# Patient Record
Sex: Female | Born: 1965 | Race: White | Hispanic: No | Marital: Married | Smoking: Former smoker
Health system: Southern US, Community
[De-identification: ages and names within clinical notes are randomized; demographics above are authoritative.]

## PROBLEM LIST (undated history)

## (undated) DIAGNOSIS — E119 Type 2 diabetes mellitus without complications: Secondary | ICD-10-CM

## (undated) DIAGNOSIS — F32A Depression, unspecified: Secondary | ICD-10-CM

## (undated) DIAGNOSIS — K219 Gastro-esophageal reflux disease without esophagitis: Secondary | ICD-10-CM

## (undated) DIAGNOSIS — F329 Major depressive disorder, single episode, unspecified: Secondary | ICD-10-CM

## (undated) DIAGNOSIS — M1990 Unspecified osteoarthritis, unspecified site: Secondary | ICD-10-CM

## (undated) DIAGNOSIS — R51 Headache: Secondary | ICD-10-CM

## (undated) DIAGNOSIS — F419 Anxiety disorder, unspecified: Secondary | ICD-10-CM

## (undated) DIAGNOSIS — I10 Essential (primary) hypertension: Secondary | ICD-10-CM

---

## 2011-06-24 HISTORY — PX: JOINT REPLACEMENT: SHX530

## 2013-05-04 ENCOUNTER — Other Ambulatory Visit (HOSPITAL_COMMUNITY): Payer: Self-pay | Admitting: Orthopaedic Surgery

## 2013-05-12 ENCOUNTER — Encounter (HOSPITAL_COMMUNITY)
Admission: RE | Admit: 2013-05-12 | Discharge: 2013-05-12 | Disposition: A | Discharge status: Home / Self Care | Payer: BC Managed Care – PPO | Source: Ambulatory Visit | Attending: Orthopaedic Surgery | Admitting: Orthopaedic Surgery

## 2013-05-12 DIAGNOSIS — M79609 Pain in unspecified limb: Secondary | ICD-10-CM | POA: Insufficient documentation

## 2013-05-12 MED ORDER — TECHNETIUM TC 99M MEDRONATE IV KIT
25.0000 | PACK | Freq: Once | INTRAVENOUS | Status: AC | PRN
  Administered 2013-05-12: 25 via INTRAVENOUS

## 2013-05-16 ENCOUNTER — Other Ambulatory Visit: Payer: Self-pay | Admitting: Orthopaedic Surgery

## 2013-05-16 DIAGNOSIS — M898X9 Other specified disorders of bone, unspecified site: Secondary | ICD-10-CM

## 2013-05-18 ENCOUNTER — Ambulatory Visit
Admission: RE | Admit: 2013-05-18 | Discharge: 2013-05-18 | Disposition: A | Discharge status: Home / Self Care | Payer: BC Managed Care – PPO | Source: Ambulatory Visit | Attending: Orthopaedic Surgery | Admitting: Orthopaedic Surgery

## 2013-05-18 ENCOUNTER — Other Ambulatory Visit: Payer: Self-pay | Admitting: Orthopaedic Surgery

## 2013-05-18 DIAGNOSIS — M898X9 Other specified disorders of bone, unspecified site: Secondary | ICD-10-CM

## 2013-06-03 ENCOUNTER — Encounter (HOSPITAL_COMMUNITY): Payer: Self-pay | Admitting: Pharmacy Technician

## 2013-06-07 ENCOUNTER — Other Ambulatory Visit (HOSPITAL_COMMUNITY): Payer: Self-pay | Admitting: Orthopaedic Surgery

## 2013-06-07 ENCOUNTER — Encounter (HOSPITAL_COMMUNITY)
Admission: RE | Admit: 2013-06-07 | Discharge: 2013-06-07 | Disposition: A | Discharge status: Home / Self Care | Payer: BC Managed Care – PPO | Source: Ambulatory Visit | Attending: Orthopaedic Surgery | Admitting: Orthopaedic Surgery

## 2013-06-07 ENCOUNTER — Ambulatory Visit (HOSPITAL_COMMUNITY)
Admission: RE | Admit: 2013-06-07 | Discharge: 2013-06-07 | Disposition: A | Discharge status: Home / Self Care | Payer: BC Managed Care – PPO | Source: Ambulatory Visit | Attending: Orthopaedic Surgery | Admitting: Orthopaedic Surgery

## 2013-06-07 ENCOUNTER — Encounter (HOSPITAL_COMMUNITY): Payer: Self-pay

## 2013-06-07 DIAGNOSIS — I10 Essential (primary) hypertension: Secondary | ICD-10-CM | POA: Insufficient documentation

## 2013-06-07 DIAGNOSIS — E119 Type 2 diabetes mellitus without complications: Secondary | ICD-10-CM | POA: Insufficient documentation

## 2013-06-07 HISTORY — DX: Gastro-esophageal reflux disease without esophagitis: K21.9

## 2013-06-07 HISTORY — DX: Essential (primary) hypertension: I10

## 2013-06-07 HISTORY — DX: Anxiety disorder, unspecified: F41.9

## 2013-06-07 HISTORY — DX: Headache: R51

## 2013-06-07 HISTORY — DX: Type 2 diabetes mellitus without complications: E11.9

## 2013-06-07 HISTORY — DX: Unspecified osteoarthritis, unspecified site: M19.90

## 2013-06-07 HISTORY — DX: Major depressive disorder, single episode, unspecified: F32.9

## 2013-06-07 HISTORY — DX: Depression, unspecified: F32.A

## 2013-06-07 LAB — CBC
HCT: 39 % (ref 36.0–46.0)
MCHC: 31.8 g/dL (ref 30.0–36.0)
MCV: 80.7 fL (ref 78.0–100.0)
Platelets: 389 10*3/uL (ref 150–400)
RBC: 4.83 MIL/uL (ref 3.87–5.11)
RDW: 14.8 % (ref 11.5–15.5)
WBC: 14.1 10*3/uL — ABNORMAL HIGH (ref 4.0–10.5)

## 2013-06-07 LAB — COMPREHENSIVE METABOLIC PANEL
ALT: 13 U/L (ref 0–35)
AST: 14 U/L (ref 0–37)
Albumin: 3.7 g/dL (ref 3.5–5.2)
CO2: 25 mEq/L (ref 19–32)
Chloride: 102 mEq/L (ref 96–112)
GFR calc non Af Amer: 76 mL/min — ABNORMAL LOW (ref >90)
Potassium: 4.7 mEq/L (ref 3.5–5.1)
Sodium: 138 mEq/L (ref 135–145)
Total Bilirubin: 0.3 mg/dL (ref 0.3–1.2)

## 2013-06-07 NOTE — Progress Notes (Signed)
06/07/13 1039  OBSTRUCTIVE SLEEP APNEA  Have you ever been diagnosed with sleep apnea through a sleep study? No  Do you snore loudly (loud enough to be heard through closed doors)?  1  Do you often feel tired, fatigued, or sleepy during the daytime? 1  Has anyone observed you stop breathing during your sleep? 0  Do you have, or are you being treated for high blood pressure? 1  BMI more than 35 kg/m2? 1  Age over 49 years old? 0  Neck circumference greater than 40 cm/18 inches? 0  Gender: 0  Obstructive Sleep Apnea Score 4  Score 4 or greater  Results sent to PCP    06/07/13 1039  OBSTRUCTIVE SLEEP APNEA  Have you ever been diagnosed with sleep apnea through a sleep study? No  Do you snore loudly (loud enough to be heard through closed doors)?  1  Do you often feel tired, fatigued, or sleepy during the daytime? 1  Has anyone observed you stop breathing during your sleep? 0  Do you have, or are you being treated for high blood pressure? 1  BMI more than 35 kg/m2? 1  Age over 94 years old? 0  Neck circumference greater than 40 cm/18 inches? 0  Gender: 0  Obstructive Sleep Apnea Score 4  Score 4 or greater  Results sent to PCP

## 2013-06-07 NOTE — Progress Notes (Addendum)
Requested any old ekg for comparison from her PCP  Dr. Meredith Mody  [phone]--DA 11:34 AM ..PCP has no old EKG to compare.  Concerning the WBC result--per anesthesia guideline, this result is within acceptable parameters.Marland Kitchen  DA

## 2013-06-07 NOTE — Pre-Procedure Instructions (Signed)
Erica Hughes  06/07/2013   Your procedure is scheduled on:  Friday, Dec. 19th              [facility] Parking is available)   Report to [facility]  2 * 3 at  5:30 AM.  Call this number if you have problems the morning of surgery: [phone]   Remember:   Do not eat food or drink liquids after midnight Thursday.   Take these medicines the morning of surgery with A SIP OF WATER: Abilify, Gabapentin, Metoprolol, Omeprazole   Do not wear jewelry, make-up or nail polish.  Do not wear lotions, powders, or perfumes. You may NOT wear deodorant.  Do not shave underarms & legs 48 hours prior to surgery.    Do not bring valuables to the hospital.  [facility] is not responsible for any belongings or valuables.               Contacts, dentures or bridgework may not be worn into surgery.  Leave suitcase in the car. After surgery it may be brought to your room.  For patients admitted to the hospital, discharge time is determined by your treatment team.               Patients discharged the day of surgery will not be allowed to drive home.   Name and phone number of your driver:    Special Instructions: Shower using CHG 2 nights before surgery and the night before surgery.  If you shower the day of surgery use CHG.  Use special wash - you have one bottle of CHG for all showers.  You should use approximately 1/3 of the bottle for each shower.    Please read over the following fact sheets that you were given: Pain Booklet and Surgical Site Infection Prevention

## 2013-06-07 NOTE — Progress Notes (Signed)
Left a message for Erica Hughes to have Dr. Ophelia Charter sign his orders.  DA

## 2013-06-07 NOTE — Progress Notes (Deleted)
06/07/13 1020  OBSTRUCTIVE SLEEP APNEA  Have you ever been diagnosed with sleep apnea through a sleep study? No  Do you snore loudly (loud enough to be heard through closed doors)?  1  Do you often feel tired, fatigued, or sleepy during the daytime? 1  Has anyone observed you stop breathing during your sleep? 0  Do you have, or are you being treated for high blood pressure? 1  BMI more than 35 kg/m2? 1  Age over 49 years old? 0  Neck circumference greater than 40 cm/18 inches? 0  Gender: 0  Obstructive Sleep Apnea Score 4  Score 4 or greater  Results sent to PCP

## 2013-06-08 NOTE — H&P (Signed)
[facility]   A Division of [employer] [facility]   [address] [place], [place] [address] Telephone: [phone]  Fax: [phone]     PATIENT: Autumm, Hattery   MR#: [id number]  DOB: [birth year]       A 47 year old female, 1 year post total knee arthroplasty by Dr. Chaney Malling was referred by her sister-in-law.  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 Chaney Malling in 2013 at [facility]. She has had problems with range of motion.  She has been able to ambulate without assistive devices but has a 10- to 20-degree extension lag.  Has pain with flexion past 80 degrees with a large, palpable heterotopic mass in the distal vastus lateralis.  She has been through extensive physical therapy.  She is normally followed by Dr. Meredith Mody.  Patient is a diabetic. She has been worked up with 3-phase bone scan which shows increased uptake superior to the femoral condyles on the left.  There is a calcified mass in the lateralis that measures 6.5 cm x 5 cm and extends on the spot from just proximal to the anterior femoral cut just above the prosthesis.  Postop radiographs show metal-backed patella, LCS is satisfactory position.  Tibial component is in slight varus.  Patellar tracking is satisfactory.  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  A small amount of posterior calcification in the posterior capsule.  Patient denies any falling or significant trauma to her thigh postoperatively. She does have a palpable quad tendon which is more easily palpated medially and slightly centrally.  She has tenderness over the heterotopic bone ossification.  This is benign in appearance.  Mild uptake around the femoral condyles, slightly more prominent than normal but no changes on plain radiograph that would suggest component loosening.   CT scan shows heterotopic bone formation proximal to the component along the  anterior cortex without notching. Some calcification appears to be in the quadriceps tendon, and this may be consistent with partial rupture which has healed with extensive calcification.   Patient continues to have problems; has not been able to progress.  She states she is interested in having surgery done in [place].   CURRENT MEDICATIONS:  Include metformin for her diabetes.  She takes Lantus insulin 80 units daily.  She is on enalapril, Pristiq, Neurontin, Abilify, vitamin D3, Valium 2 mg, Seroquel, zolpidem, metoprolol, omeprazole, calcium, and Depo-Provera.  Dosages other than her insulin are not known by the patient, but she will bring them in.   ALLERGIES:  CODEINE and PENICILLIN.   PAST SURGICAL HISTORY:  Includes December 2013 left total knee arthroplasty.   FAMILY HISTORY:  Positive for hypertension, lung cancer, diabetes, and heart disease.   SOCIAL HISTORY:  She is married to her husband, Tawanna Cooler.  She does not smoke; used to be a smoker and quit 21 years ago.  She denies alcohol consumption.   FOURTEEN-POINT REVIEW OF SYSTEMS:  Positive for acid reflux, anxiety, arthritis, depression, diabetes insulin dependent, hypertension, and history of migraines.   PHYSICAL EXAMINATION:  Patient is alert and oriented, WN, WN, NAD.  Extraocular movements intact.  Oriented x3.  Height 5 feet 5 inches, weight 274.  BP 132/80.  No accessory muscle inspiratory effort.  No cardiac murmurs.  Hip range of motion is normal, 1+ knee jerk.  She has some weakness on quad testing, palpable heterotopic bone above the patella proximal lateral where she is tender with  extension with flexion.     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   We discussed options with her.  We discussed postoperative Indocin versus radiation treatment.  CT scan shows that this distally is attached to the distal  femoral cortex just above the anterior femoral cut.  There is no notching.  She does not have abnormal effusion.  She has had no history of fever or chills.  She has no inguinal lymphadenopathy.  Distal pulses are 2+.     PLAN:  Resection of heterotopic ossification.  We will evaluate the prosthesis at the time of surgery.  I discussed with her I do not think revision of the components is indicated.  If there is quad tendon deficit in a portion of the quad tendon, she may need to have augmentation or repair of the quad tendon post resection of the heterotopic ossification.  This was reviewed with her in detail.  She understands that she may not get improvement in her knee range of motion, and she would get resolution of the mass. There is a risk for recurrence of heterotopic ossification.  All questions answered.  She understands and requests we proceed.    For additional information please see handwritten notes, reports, orders and prescriptions in this chart.      Mark C. Ophelia Charter, M.D.    Auto-Authenticated by Veverly Fells. Ophelia Charter, M.D.

## 2013-06-09 MED ORDER — VANCOMYCIN HCL 10 G IV SOLR
1500.0000 mg | INTRAVENOUS | Status: DC
  Filled 2013-06-09: qty 1500

## 2013-06-09 MED ORDER — CHLORHEXIDINE GLUCONATE 4 % EX LIQD: 60.0000 mL | Freq: Once | CUTANEOUS | Status: DC

## 2013-06-10 ENCOUNTER — Inpatient Hospital Stay (HOSPITAL_COMMUNITY)
Admission: RE | Admit: 2013-06-10 | Discharge: 2013-06-12 | DRG: 502 | Disposition: A | Discharge status: Home / Self Care | Payer: BC Managed Care – PPO | Source: Ambulatory Visit | Attending: Orthopaedic Surgery | Admitting: Orthopaedic Surgery

## 2013-06-10 ENCOUNTER — Encounter (HOSPITAL_COMMUNITY): Payer: BC Managed Care – PPO | Admitting: Anesthesiology

## 2013-06-10 ENCOUNTER — Encounter (HOSPITAL_COMMUNITY): Payer: Self-pay | Admitting: Surgery

## 2013-06-10 ENCOUNTER — Encounter (HOSPITAL_COMMUNITY)
Admission: RE | Disposition: A | Discharge status: Home / Self Care | Payer: Self-pay | Source: Ambulatory Visit | Attending: Orthopaedic Surgery

## 2013-06-10 ENCOUNTER — Inpatient Hospital Stay (HOSPITAL_COMMUNITY): Payer: BC Managed Care – PPO

## 2013-06-10 ENCOUNTER — Inpatient Hospital Stay (HOSPITAL_COMMUNITY): Payer: BC Managed Care – PPO | Admitting: Anesthesiology

## 2013-06-10 DIAGNOSIS — I10 Essential (primary) hypertension: Secondary | ICD-10-CM | POA: Diagnosis present

## 2013-06-10 DIAGNOSIS — M6140 Other calcification of muscle, unspecified site: Principal | ICD-10-CM | POA: Diagnosis present

## 2013-06-10 DIAGNOSIS — F3289 Other specified depressive episodes: Secondary | ICD-10-CM | POA: Diagnosis present

## 2013-06-10 DIAGNOSIS — K219 Gastro-esophageal reflux disease without esophagitis: Secondary | ICD-10-CM | POA: Diagnosis present

## 2013-06-10 DIAGNOSIS — Z87891 Personal history of nicotine dependence: Secondary | ICD-10-CM

## 2013-06-10 DIAGNOSIS — F411 Generalized anxiety disorder: Secondary | ICD-10-CM | POA: Diagnosis present

## 2013-06-10 DIAGNOSIS — M25669 Stiffness of unspecified knee, not elsewhere classified: Secondary | ICD-10-CM | POA: Diagnosis present

## 2013-06-10 DIAGNOSIS — F329 Major depressive disorder, single episode, unspecified: Secondary | ICD-10-CM | POA: Diagnosis present

## 2013-06-10 DIAGNOSIS — Z96659 Presence of unspecified artificial knee joint: Secondary | ICD-10-CM

## 2013-06-10 DIAGNOSIS — Z794 Long term (current) use of insulin: Secondary | ICD-10-CM

## 2013-06-10 DIAGNOSIS — M6520 Calcific tendinitis, unspecified site: Secondary | ICD-10-CM | POA: Diagnosis present

## 2013-06-10 DIAGNOSIS — M129 Arthropathy, unspecified: Secondary | ICD-10-CM | POA: Diagnosis present

## 2013-06-10 DIAGNOSIS — M898X9 Other specified disorders of bone, unspecified site: Secondary | ICD-10-CM | POA: Diagnosis present

## 2013-06-10 DIAGNOSIS — E119 Type 2 diabetes mellitus without complications: Secondary | ICD-10-CM | POA: Diagnosis present

## 2013-06-10 HISTORY — PX: KNEE CLOSED REDUCTION: SHX995

## 2013-06-10 HISTORY — PX: OSTEOCHONDROMA EXCISION: SHX2137

## 2013-06-10 LAB — URINE MICROSCOPIC-ADD ON

## 2013-06-10 LAB — URINALYSIS, ROUTINE W REFLEX MICROSCOPIC
Glucose, UA: NEGATIVE mg/dL
Ketones, ur: 15 mg/dL — AB
Leukocytes, UA: NEGATIVE
Protein, ur: 30 mg/dL — AB
Specific Gravity, Urine: 1.036 — ABNORMAL HIGH (ref 1.005–1.030)
pH: 5.5 (ref 5.0–8.0)

## 2013-06-10 LAB — GLUCOSE, CAPILLARY: Glucose-Capillary: 229 mg/dL — ABNORMAL HIGH (ref 70–99)

## 2013-06-10 LAB — APTT: aPTT: 29 seconds (ref 24–37)

## 2013-06-10 LAB — PROTIME-INR: INR: 0.94 (ref 0.00–1.49)

## 2013-06-10 SURGERY — MANIPULATION, KNEE, CLOSED
Anesthesia: General | Site: Knee | Laterality: Left

## 2013-06-10 MED ORDER — FENTANYL CITRATE 0.05 MG/ML IJ SOLN
INTRAMUSCULAR | Status: DC | PRN
  Administered 2013-06-10: 50 ug via INTRAVENOUS
  Administered 2013-06-10: 100 ug via INTRAVENOUS
  Administered 2013-06-10: 50 ug via INTRAVENOUS
  Administered 2013-06-10: 150 ug via INTRAVENOUS
  Administered 2013-06-10: 50 ug via INTRAVENOUS

## 2013-06-10 MED ORDER — BISACODYL 10 MG RE SUPP: 10.0000 mg | Freq: Every day | RECTAL | Status: DC | PRN

## 2013-06-10 MED ORDER — METFORMIN HCL 500 MG PO TABS: 1000.0000 mg | ORAL_TABLET | Freq: Two times a day (BID) | ORAL | Status: DC

## 2013-06-10 MED ORDER — QUETIAPINE FUMARATE ER 200 MG PO TB24
200.0000 mg | ORAL_TABLET | Freq: Every day | ORAL | Status: DC
  Administered 2013-06-10 – 2013-06-11 (×2): 200 mg via ORAL
  Filled 2013-06-10 (×4): qty 1

## 2013-06-10 MED ORDER — CLINDAMYCIN PHOSPHATE 900 MG/50ML IV SOLN
900.0000 mg | INTRAVENOUS | Status: AC
  Administered 2013-06-10: 900 mg via INTRAVENOUS
  Filled 2013-06-10: qty 50

## 2013-06-10 MED ORDER — ONDANSETRON HCL 4 MG/2ML IJ SOLN
INTRAMUSCULAR | Status: DC | PRN
  Administered 2013-06-10: 4 mg via INTRAVENOUS

## 2013-06-10 MED ORDER — 0.9 % SODIUM CHLORIDE (POUR BTL) OPTIME
TOPICAL | Status: DC | PRN
  Administered 2013-06-10: 1000 mL

## 2013-06-10 MED ORDER — METFORMIN HCL 500 MG PO TABS
1000.0000 mg | ORAL_TABLET | Freq: Two times a day (BID) | ORAL | Status: DC
  Administered 2013-06-10 – 2013-06-12 (×4): 1000 mg via ORAL
  Filled 2013-06-10 (×6): qty 2

## 2013-06-10 MED ORDER — INSULIN GLARGINE 100 UNIT/ML ~~LOC~~ SOLN
20.0000 [IU] | Freq: Every day | SUBCUTANEOUS | Status: DC
  Administered 2013-06-10 – 2013-06-11 (×2): 20 [IU] via SUBCUTANEOUS
  Filled 2013-06-10 (×3): qty 0.2

## 2013-06-10 MED ORDER — ACETAMINOPHEN 650 MG RE SUPP: 650.0000 mg | Freq: Four times a day (QID) | RECTAL | Status: DC | PRN

## 2013-06-10 MED ORDER — MIDAZOLAM HCL 5 MG/5ML IJ SOLN
INTRAMUSCULAR | Status: DC | PRN
  Administered 2013-06-10: 1 mg via INTRAVENOUS

## 2013-06-10 MED ORDER — INSULIN ASPART 100 UNIT/ML ~~LOC~~ SOLN
0.0000 [IU] | Freq: Three times a day (TID) | SUBCUTANEOUS | Status: DC
  Administered 2013-06-10 – 2013-06-11 (×3): 3 [IU] via SUBCUTANEOUS
  Administered 2013-06-11: 5 [IU] via SUBCUTANEOUS
  Administered 2013-06-12 (×2): 3 [IU] via SUBCUTANEOUS

## 2013-06-10 MED ORDER — ROCURONIUM BROMIDE 100 MG/10ML IV SOLN
INTRAVENOUS | Status: DC | PRN
  Administered 2013-06-10: 10 mg via INTRAVENOUS
  Administered 2013-06-10: 30 mg via INTRAVENOUS

## 2013-06-10 MED ORDER — ACETAMINOPHEN 500 MG PO TABS: 1000.0000 mg | ORAL_TABLET | Freq: Four times a day (QID) | ORAL | Status: DC | PRN

## 2013-06-10 MED ORDER — GABAPENTIN 300 MG PO CAPS
300.0000 mg | ORAL_CAPSULE | Freq: Two times a day (BID) | ORAL | Status: DC
  Administered 2013-06-10 – 2013-06-12 (×4): 300 mg via ORAL
  Filled 2013-06-10 (×5): qty 1

## 2013-06-10 MED ORDER — FENTANYL CITRATE 0.05 MG/ML IJ SOLN
25.0000 ug | INTRAMUSCULAR | Status: DC | PRN
  Administered 2013-06-10 (×4): 25 ug via INTRAVENOUS

## 2013-06-10 MED ORDER — ONDANSETRON HCL 4 MG PO TABS: 4.0000 mg | ORAL_TABLET | Freq: Four times a day (QID) | ORAL | Status: DC | PRN

## 2013-06-10 MED ORDER — MENTHOL 3 MG MT LOZG
1.0000 | LOZENGE | OROMUCOSAL | Status: DC | PRN
  Administered 2013-06-11: 3 mg via ORAL
  Filled 2013-06-10: qty 9

## 2013-06-10 MED ORDER — ARIPIPRAZOLE 5 MG PO TABS
5.0000 mg | ORAL_TABLET | Freq: Every day | ORAL | Status: DC
  Administered 2013-06-11 – 2013-06-12 (×2): 5 mg via ORAL
  Filled 2013-06-10 (×2): qty 1

## 2013-06-10 MED ORDER — FLEET ENEMA 7-19 GM/118ML RE ENEM: 1.0000 | ENEMA | Freq: Once | RECTAL | Status: AC | PRN

## 2013-06-10 MED ORDER — METHOCARBAMOL 100 MG/ML IJ SOLN
500.0000 mg | Freq: Four times a day (QID) | INTRAVENOUS | Status: DC | PRN
  Filled 2013-06-10: qty 5

## 2013-06-10 MED ORDER — CHOLECALCIFEROL 10 MCG (400 UNIT) PO TABS
400.0000 [IU] | ORAL_TABLET | Freq: Two times a day (BID) | ORAL | Status: DC
  Administered 2013-06-10 – 2013-06-12 (×4): 400 [IU] via ORAL
  Filled 2013-06-10 (×5): qty 1

## 2013-06-10 MED ORDER — PANTOPRAZOLE SODIUM 40 MG PO TBEC
40.0000 mg | DELAYED_RELEASE_TABLET | Freq: Every day | ORAL | Status: DC
  Administered 2013-06-11 – 2013-06-12 (×2): 40 mg via ORAL
  Filled 2013-06-10 (×2): qty 1

## 2013-06-10 MED ORDER — INSULIN GLARGINE 100 UNIT/ML ~~LOC~~ SOLN
60.0000 [IU] | Freq: Every day | SUBCUTANEOUS | Status: DC
  Administered 2013-06-11 – 2013-06-12 (×2): 60 [IU] via SUBCUTANEOUS
  Filled 2013-06-10 (×2): qty 0.6

## 2013-06-10 MED ORDER — METOCLOPRAMIDE HCL 10 MG PO TABS: 5.0000 mg | ORAL_TABLET | Freq: Three times a day (TID) | ORAL | Status: DC | PRN

## 2013-06-10 MED ORDER — METOCLOPRAMIDE HCL 5 MG/ML IJ SOLN
5.0000 mg | Freq: Once | INTRAMUSCULAR | Status: AC
  Administered 2013-06-10: 5 mg via INTRAVENOUS

## 2013-06-10 MED ORDER — VENLAFAXINE HCL ER 75 MG PO CP24
75.0000 mg | ORAL_CAPSULE | Freq: Every day | ORAL | Status: DC
  Administered 2013-06-11 – 2013-06-12 (×2): 75 mg via ORAL
  Filled 2013-06-10 (×3): qty 1

## 2013-06-10 MED ORDER — FENTANYL CITRATE 0.05 MG/ML IJ SOLN
INTRAMUSCULAR | Status: AC
  Filled 2013-06-10: qty 2

## 2013-06-10 MED ORDER — ZOLPIDEM TARTRATE 5 MG PO TABS
5.0000 mg | ORAL_TABLET | Freq: Every evening | ORAL | Status: DC | PRN
  Administered 2013-06-10: 5 mg via ORAL
  Filled 2013-06-10: qty 1

## 2013-06-10 MED ORDER — METOCLOPRAMIDE HCL 5 MG/ML IJ SOLN
INTRAMUSCULAR | Status: AC
  Filled 2013-06-10: qty 2

## 2013-06-10 MED ORDER — METOCLOPRAMIDE HCL 5 MG/ML IJ SOLN
10.0000 mg | Freq: Four times a day (QID) | INTRAMUSCULAR | Status: DC | PRN
  Filled 2013-06-10: qty 2

## 2013-06-10 MED ORDER — POTASSIUM CHLORIDE IN NACL 20-0.45 MEQ/L-% IV SOLN
INTRAVENOUS | Status: DC
  Administered 2013-06-10 – 2013-06-11 (×2): via INTRAVENOUS
  Filled 2013-06-10 (×5): qty 1000

## 2013-06-10 MED ORDER — ONDANSETRON HCL 4 MG/2ML IJ SOLN: 4.0000 mg | Freq: Four times a day (QID) | INTRAMUSCULAR | Status: DC | PRN

## 2013-06-10 MED ORDER — KETOROLAC TROMETHAMINE 15 MG/ML IJ SOLN
15.0000 mg | Freq: Four times a day (QID) | INTRAMUSCULAR | Status: AC
  Administered 2013-06-10 – 2013-06-11 (×4): 15 mg via INTRAVENOUS
  Filled 2013-06-10 (×2): qty 1

## 2013-06-10 MED ORDER — PROPOFOL 10 MG/ML IV BOLUS
INTRAVENOUS | Status: DC | PRN
  Administered 2013-06-10: 160 mg via INTRAVENOUS
  Administered 2013-06-10: 40 mg via INTRAVENOUS

## 2013-06-10 MED ORDER — HYDROMORPHONE HCL 2 MG PO TABS: 4.0000 mg | ORAL_TABLET | ORAL | Status: DC | PRN

## 2013-06-10 MED ORDER — LACTATED RINGERS IV SOLN
INTRAVENOUS | Status: DC | PRN
  Administered 2013-06-10 (×2): via INTRAVENOUS

## 2013-06-10 MED ORDER — ACETAMINOPHEN 325 MG PO TABS: 650.0000 mg | ORAL_TABLET | Freq: Four times a day (QID) | ORAL | Status: DC | PRN

## 2013-06-10 MED ORDER — METHOCARBAMOL 500 MG PO TABS: 500.0000 mg | ORAL_TABLET | Freq: Four times a day (QID) | ORAL | Status: DC | PRN

## 2013-06-10 MED ORDER — CALCIUM CARBONATE 600 MG PO TABS
600.0000 mg | ORAL_TABLET | Freq: Two times a day (BID) | ORAL | Status: DC
  Administered 2013-06-10: 600 mg via ORAL
  Filled 2013-06-10 (×4): qty 1

## 2013-06-10 MED ORDER — LIDOCAINE HCL (CARDIAC) 20 MG/ML IV SOLN
INTRAVENOUS | Status: DC | PRN
  Administered 2013-06-10: 80 mg via INTRAVENOUS

## 2013-06-10 MED ORDER — SUCCINYLCHOLINE CHLORIDE 20 MG/ML IJ SOLN
INTRAMUSCULAR | Status: DC | PRN
  Administered 2013-06-10: 100 mg via INTRAVENOUS

## 2013-06-10 MED ORDER — DOCUSATE SODIUM 100 MG PO CAPS
100.0000 mg | ORAL_CAPSULE | Freq: Two times a day (BID) | ORAL | Status: DC
  Administered 2013-06-10 – 2013-06-12 (×4): 100 mg via ORAL
  Filled 2013-06-10 (×5): qty 1

## 2013-06-10 MED ORDER — NEOSTIGMINE METHYLSULFATE 1 MG/ML IJ SOLN
INTRAMUSCULAR | Status: DC | PRN
  Administered 2013-06-10: 3 mg via INTRAVENOUS

## 2013-06-10 MED ORDER — GLYCOPYRROLATE 0.2 MG/ML IJ SOLN
INTRAMUSCULAR | Status: DC | PRN
  Administered 2013-06-10: 0.4 mg via INTRAVENOUS

## 2013-06-10 MED ORDER — SENNOSIDES-DOCUSATE SODIUM 8.6-50 MG PO TABS: 1.0000 | ORAL_TABLET | Freq: Every evening | ORAL | Status: DC | PRN

## 2013-06-10 MED ORDER — ENALAPRIL MALEATE 20 MG PO TABS
20.0000 mg | ORAL_TABLET | Freq: Every day | ORAL | Status: DC
  Administered 2013-06-10 – 2013-06-12 (×3): 20 mg via ORAL
  Filled 2013-06-10 (×3): qty 1

## 2013-06-10 MED ORDER — METOPROLOL TARTRATE 100 MG PO TABS
100.0000 mg | ORAL_TABLET | Freq: Two times a day (BID) | ORAL | Status: DC
  Administered 2013-06-10 – 2013-06-12 (×4): 100 mg via ORAL
  Filled 2013-06-10 (×5): qty 1

## 2013-06-10 MED ORDER — PHENOL 1.4 % MT LIQD: 1.0000 | OROMUCOSAL | Status: DC | PRN

## 2013-06-10 MED ORDER — METOCLOPRAMIDE HCL 5 MG/ML IJ SOLN: 5.0000 mg | Freq: Three times a day (TID) | INTRAMUSCULAR | Status: DC | PRN

## 2013-06-10 MED ORDER — HYDROMORPHONE HCL PF 1 MG/ML IJ SOLN: 0.5000 mg | INTRAMUSCULAR | Status: DC | PRN

## 2013-06-10 MED ORDER — ASPIRIN EC 325 MG PO TBEC
325.0000 mg | DELAYED_RELEASE_TABLET | Freq: Every day | ORAL | Status: DC
  Administered 2013-06-11 – 2013-06-12 (×2): 325 mg via ORAL
  Filled 2013-06-10 (×3): qty 1

## 2013-06-10 MED ORDER — OXYCODONE HCL 5 MG PO TABS: 5.0000 mg | ORAL_TABLET | ORAL | Status: DC | PRN

## 2013-06-10 SURGICAL SUPPLY — 77 items
BANDAGE CONFORM 3  STR LF (GAUZE/BANDAGES/DRESSINGS) IMPLANT
BANDAGE ELASTIC 4 VELCRO ST LF (GAUZE/BANDAGES/DRESSINGS) IMPLANT
BANDAGE ELASTIC 6 VELCRO ST LF (GAUZE/BANDAGES/DRESSINGS) ×2 IMPLANT
BANDAGE ESMARK 6X9 LF (GAUZE/BANDAGES/DRESSINGS) ×1 IMPLANT
BENZOIN TINCTURE PRP APPL 2/3 (GAUZE/BANDAGES/DRESSINGS) ×2 IMPLANT
BLADE SURG 10 STRL SS (BLADE) ×2 IMPLANT
BLADE SURG ROTATE 9660 (MISCELLANEOUS) IMPLANT
BNDG COHESIVE 1X5 TAN STRL LF (GAUZE/BANDAGES/DRESSINGS) IMPLANT
BNDG ELASTIC 2 VLCR STRL LF (GAUZE/BANDAGES/DRESSINGS) IMPLANT
BNDG ESMARK 6X9 LF (GAUZE/BANDAGES/DRESSINGS) ×2
CANISTER SUCTION 2500CC (MISCELLANEOUS) ×2 IMPLANT
CLOTH BEACON ORANGE TIMEOUT ST (SAFETY) ×2 IMPLANT
CORDS BIPOLAR (ELECTRODE) IMPLANT
COVER MAYO STAND STRL (DRAPES) ×2 IMPLANT
COVER SURGICAL LIGHT HANDLE (MISCELLANEOUS) ×2 IMPLANT
CUFF TOURNIQUET SINGLE 18IN (TOURNIQUET CUFF) IMPLANT
CUFF TOURNIQUET SINGLE 24IN (TOURNIQUET CUFF) IMPLANT
CUFF TOURNIQUET SINGLE 44IN (TOURNIQUET CUFF) ×2 IMPLANT
DRAPE INCISE IOBAN 66X45 STRL (DRAPES) ×2 IMPLANT
DRAPE PROXIMA HALF (DRAPES) ×2 IMPLANT
DRAPE X-RAY CASS 24X20 (DRAPES) ×4 IMPLANT
DRSG EMULSION OIL 3X3 NADH (GAUZE/BANDAGES/DRESSINGS) IMPLANT
DURAPREP 26ML APPLICATOR (WOUND CARE) ×2 IMPLANT
ELECT BLADE 4.0 EZ CLEAN MEGAD (MISCELLANEOUS) ×2
ELECT REM PT RETURN 9FT ADLT (ELECTROSURGICAL) ×2
ELECTRODE BLDE 4.0 EZ CLN MEGD (MISCELLANEOUS) ×1 IMPLANT
ELECTRODE REM PT RTRN 9FT ADLT (ELECTROSURGICAL) ×1 IMPLANT
EVACUATOR 1/8 PVC DRAIN (DRAIN) ×2 IMPLANT
GAUZE SPONGE 2X2 8PLY STRL LF (GAUZE/BANDAGES/DRESSINGS) IMPLANT
GAUZE XEROFORM 1X8 LF (GAUZE/BANDAGES/DRESSINGS) ×2 IMPLANT
GLOVE BIOGEL PI IND STRL 7.5 (GLOVE) ×1 IMPLANT
GLOVE BIOGEL PI IND STRL 8 (GLOVE) ×1 IMPLANT
GLOVE BIOGEL PI INDICATOR 7.5 (GLOVE) ×1
GLOVE BIOGEL PI INDICATOR 8 (GLOVE) ×1
GLOVE ECLIPSE 7.0 STRL STRAW (GLOVE) ×2 IMPLANT
GLOVE ORTHO TXT STRL SZ7.5 (GLOVE) ×2 IMPLANT
GOWN PREVENTION PLUS LG XLONG (DISPOSABLE) IMPLANT
GOWN STRL NON-REIN LRG LVL3 (GOWN DISPOSABLE) ×4 IMPLANT
KIT BASIN OR (CUSTOM PROCEDURE TRAY) ×2 IMPLANT
KIT ROOM TURNOVER OR (KITS) ×2 IMPLANT
MANIFOLD NEPTUNE II (INSTRUMENTS) IMPLANT
NS IRRIG 1000ML POUR BTL (IV SOLUTION) ×2 IMPLANT
PACK ORTHO EXTREMITY (CUSTOM PROCEDURE TRAY) ×2 IMPLANT
PAD ARMBOARD 7.5X6 YLW CONV (MISCELLANEOUS) ×4 IMPLANT
PADDING CAST ABS 4INX4YD NS (CAST SUPPLIES) ×1
PADDING CAST ABS 6INX4YD NS (CAST SUPPLIES) ×1
PADDING CAST ABS COTTON 4X4 ST (CAST SUPPLIES) ×1 IMPLANT
PADDING CAST ABS COTTON 6X4 NS (CAST SUPPLIES) ×1 IMPLANT
SPECIMEN JAR SMALL (MISCELLANEOUS) IMPLANT
SPONGE GAUZE 2X2 STER 10/PKG (GAUZE/BANDAGES/DRESSINGS)
SPONGE GAUZE 4X4 12PLY (GAUZE/BANDAGES/DRESSINGS) ×2 IMPLANT
SPONGE GAUZE 4X4 12PLY STER LF (GAUZE/BANDAGES/DRESSINGS) ×2 IMPLANT
SPONGE LAP 18X18 X RAY DECT (DISPOSABLE) ×2 IMPLANT
STRIP CLOSURE SKIN 1/2X4 (GAUZE/BANDAGES/DRESSINGS) ×2 IMPLANT
SUCTION FRAZIER TIP 10 FR DISP (SUCTIONS) IMPLANT
SUT BONE WAX W31G (SUTURE) ×4 IMPLANT
SUT ETHIBOND 4 0 TF (SUTURE) IMPLANT
SUT ETHIBOND 5 0 P 3 (SUTURE)
SUT ETHILON 4 0 P 3 18 (SUTURE) IMPLANT
SUT ETHILON 5 0 P 3 18 (SUTURE)
SUT MERSILENE 6 0 S14 DA (SUTURE) IMPLANT
SUT NYLON ETHILON 5-0 P-3 1X18 (SUTURE) IMPLANT
SUT POLY ETHIBOND 5-0 P-3 1X18 (SUTURE) IMPLANT
SUT PROLENE 4 0 P 3 18 (SUTURE) IMPLANT
SUT SILK 4 0 PS 2 (SUTURE) IMPLANT
SUT VIC AB 0 CT1 27 (SUTURE) ×3
SUT VIC AB 0 CT1 27XBRD ANBCTR (SUTURE) ×3 IMPLANT
SUT VIC AB 1 CT1 27 (SUTURE) ×2
SUT VIC AB 1 CT1 27XBRD ANBCTR (SUTURE) ×2 IMPLANT
SUT VIC AB 2-0 CT1 27 (SUTURE) ×3
SUT VIC AB 2-0 CT1 TAPERPNT 27 (SUTURE) ×3 IMPLANT
SUT VICRYL 4-0 PS2 18IN ABS (SUTURE) ×2 IMPLANT
TOWEL OR 17X24 6PK STRL BLUE (TOWEL DISPOSABLE) ×2 IMPLANT
TOWEL OR 17X26 10 PK STRL BLUE (TOWEL DISPOSABLE) ×2 IMPLANT
TUBE CONNECTING 12X1/4 (SUCTIONS) ×2 IMPLANT
WATER STERILE IRR 1000ML POUR (IV SOLUTION) IMPLANT
YANKAUER SUCT BULB TIP NO VENT (SUCTIONS) ×2 IMPLANT

## 2013-06-10 NOTE — Transfer of Care (Signed)
Immediate Anesthesia Transfer of Care Note  Patient: Erica Hughes  Procedure(s) Performed: Procedure(s) with comments: CLOSED MANIPULATION KNEE (Left) - Left Thigh/Knee Excision Heterotopic Bone, Knee Manipulation OSTEOCHONDROMA EXCISION (Left)  Patient Location: PACU  Anesthesia Type:General  Level of Consciousness: awake, alert , oriented and patient cooperative  Airway & Oxygen Therapy: Patient Spontanous Breathing and Patient connected to nasal cannula oxygen  Post-op Assessment: Report given to PACU RN and Post -op Vital signs reviewed and stable  Post vital signs: Reviewed  Complications: No apparent anesthesia complications

## 2013-06-10 NOTE — Brief Op Note (Cosign Needed)
06/10/2013  10:08 AM  PATIENT:  Erica Hughes  47 y.o. female  PRE-OPERATIVE DIAGNOSIS:  Left Thigh Heterotopic Ossification  POST-OPERATIVE DIAGNOSIS:  Left Thigh Heterotopic Ossification  PROCEDURE:  Procedure(s) with comments: CLOSED MANIPULATION KNEE (Left) - Left Thigh/Knee Excision Heterotopic Bone, Knee Manipulation OSTEOCHONDROMA EXCISION (Left)  SURGEON:  Surgeon(s) and Role:    * Eldred Manges, MD - Primary  PHYSICIAN ASSISTANT: Maud Deed PAC  ASSISTANTS: none   ANESTHESIA:   general  EBL:  Total I/O In: 1250 [I.V.:1250] Out: 100 [Blood:100]  BLOOD ADMINISTERED:none  DRAINS: (1) Hemovact drain(s) in the knee with  Suction Open   LOCAL MEDICATIONS USED:  NONE  SPECIMEN:  Source of Specimen:  femur  DISPOSITION OF SPECIMEN:  PATHOLOGY  COUNTS:  YES  TOURNIQUET:   Total Tourniquet Time Documented: Thigh (Left) - 45 minutes Total: Thigh (Left) - 45 minutes   DICTATION: .Note written in EPIC  PLAN OF CARE: Admit for overnight observation  PATIENT DISPOSITION:  PACU - hemodynamically stable.   Delay start of Pharmacological VTE agent (>24hrs) due to surgical blood loss or risk of bleeding: no

## 2013-06-10 NOTE — Op Note (Signed)
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 Excision of heterotopic bone 6 cm x6 cm x4 cm. Left knee manipulation.  Surgeon: Annell Greening M.D.  Assistant: Maud Deed PA-C medically necessary and present for the entire procedure  Anesthesia: GLT  Drains: one Hemovac left knee  Procedure: After induction general anesthesia with difficulty finding IV access patient had vancomycin 1500 mg per to starting the drip. This was noticed at the time the leg and then elevated and tourniquet inflated. Tourniquet was deflated, clindamycin was obtained and run in and then leg was elevated wrapped an Esmarch again and then tourniquet  reinflated at 350 pressure and kept up until the end of the case which was 44 minutes tourniquet was deflated in the case prior to closure.  Standard prepping and draping for total knee arthroplasty been performed with a 44 inch tourniquet sterile skin marker and Betadine Steri-Drape x2 used yo  seal the scan. Midline incision was made Bovie was used meticulously for division the subcutaneous tissue. Patient's quad tendon was relatively thin. Large heterotopic mass was easily palpable and was actually in 2 pieces some medial and some lateral. This extended off of the femur starting just above the femoral prosthesis along the anterior cortical surface and extended into the quad tendon. It was carefully peeled off the Bovie electrocautery until the mass was completely uncovered extended over the lateral condyle and metaphyseal region and right up the femur 6-7 cm. Osteotomes were used to remove the mass once it was isolated from the muscle. Quad tendon repair with the old Ethibond sutures were visualized and sutures were divided on the previous quad tendon split. There was to Center a piece of bone and the quad tendon this was carefully peeled  out and then #1 Ethibond sutures were used for quad tendon repair. Femoral prosthesis was intact. Patella was press-fit and with palpation inspection there is no evidence of loosening. Tibial component was not visualized. Fluid in the joint was clear and minimal. No cultures were obtained. Continue bone removal with the inch and half osteotome and 1 inch osteotome skiving off the heterotopic bone off the anterior cortex of the femur in the meticulously bone waxing and. Cross table lateral radiograph was obtained to confirm that all the heterotopic bone had been resected. More bone wax was applied to the bony bleeding surface tourniquet deflated meticulous hemostasis Hemovac drain was placed in and out technique exited laterally suprapatellar. Quad tendon was repaired and medial parapatellar retinaculum was repaired. Incision extended down about 1 cm inferior to the patella and the old suture line and then followed carefully. Prior to repairing the quad tendon the knee was flexed there was some crepitus popping in the knee was flexed to 90. There is concerned with the patient's quad tendon showing tendinopathy as it was divided it appeared that had undergone some mucinous degeneration and may be consistent with some preoperative tendinopathy in the quad tendon which may of contributed to her HO production. Meticulous closure of the quad and retinaculum was performed. 2-0 Vicryl subtendinous tissue subjective skin closure postop dressing the immobilizer. Patient needle had been manipulated was able to flex to 90. She still had 10 flexion contracture posterior capsule was tight and this it did not really change. Copious irrigation prior to closure postop knee immobilizer. Patient tolerated procedure well and procedure was as planned. Signed Annell Greening M.D. thanks

## 2013-06-10 NOTE — Anesthesia Postprocedure Evaluation (Signed)
  Anesthesia Post-op Note  Patient: Erica Hughes  Procedure(s) Performed: Procedure(s) with comments: CLOSED MANIPULATION KNEE (Left) - Left Thigh/Knee Excision Heterotopic Bone, Knee Manipulation OSTEOCHONDROMA EXCISION (Left)  Patient Location: PACU  Anesthesia Type:General  Level of Consciousness: awake  Airway and Oxygen Therapy: Patient Spontanous Breathing  Post-op Pain: mild  Post-op Assessment: Post-op Vital signs reviewed  Post-op Vital Signs: Reviewed  Complications: No apparent anesthesia complications

## 2013-06-10 NOTE — Anesthesia Procedure Notes (Signed)
Procedure Name: Intubation Date/Time: 06/10/2013 7:47 AM Performed by: Lovie Chol Pre-anesthesia Checklist: Patient identified, Emergency Drugs available, Suction available, Patient being monitored and Timeout performed Patient Re-evaluated:Patient Re-evaluated prior to inductionOxygen Delivery Method: Circle system utilized Preoxygenation: Pre-oxygenation with 100% oxygen Intubation Type: IV induction Ventilation: Mask ventilation without difficulty Laryngoscope Size: Miller and 2 Grade View: Grade I Tube type: Oral Tube size: 7.5 mm Number of attempts: 1 Airway Equipment and Method: Stylet Placement Confirmation: ETT inserted through vocal cords under direct vision,  positive ETCO2,  CO2 detector and breath sounds checked- equal and bilateral Secured at: 21 cm Tube secured with: Tape Dental Injury: Teeth and Oropharynx as per pre-operative assessment

## 2013-06-10 NOTE — Preoperative (Signed)
Beta Blockers   Reason not to administer Beta Blockers:Not Applicable 

## 2013-06-10 NOTE — Progress Notes (Signed)
Patient ID: Erica Hughes, female   DOB: August 19, 1965, 47 y.o.   MRN: [id number]  POST OP MEDS AND DISCHARGE MEDS: Spoke with pt in regards to her allergic reaction to Codeine which is listed as anaphylaxis.  She states she took a "codeine medication" years ago and does not recall the name.  States she has not use percocet or vicoden before.  Unsure if she has any reaction to those meds.  Plan to use Dilaudid IV and will try oxycodone po for pain.  States she usually uses just tylenol.  Will we see what she tolerates best before writing an RX for home meds at discharge.  May be that she continues with only tylenol.

## 2013-06-10 NOTE — Anesthesia Preprocedure Evaluation (Addendum)
Anesthesia Evaluation  Patient identified by MRN, date of birth, ID band Patient awake    Reviewed: Allergy & Precautions, H&P , NPO status , reviewed documented beta blocker date and time   History of Anesthesia Complications Negative for: history of anesthetic complications  Airway Mallampati: II TM Distance: >3 FB Neck ROM: Full    Dental  (+) Teeth Intact and Dental Advisory Given   Pulmonary former smoker,  breath sounds clear to auscultation        Cardiovascular hypertension, Pt. on medications and Pt. on home beta blockers Rhythm:Regular Rate:Normal     Neuro/Psych  Headaches, PSYCHIATRIC DISORDERS Anxiety Depression    GI/Hepatic Neg liver ROS, GERD-  Medicated and Controlled,  Endo/Other  diabetes, Type 2, Oral Hypoglycemic AgentsCBG = 115  Renal/GU negative Renal ROS     Musculoskeletal   Abdominal   Peds negative pediatric ROS (+)  Hematology   Anesthesia Other Findings   Reproductive/Obstetrics negative OB ROS                       Anesthesia Physical Anesthesia Plan  ASA: III  Anesthesia Plan: General   Post-op Pain Management:    Induction: Intravenous  Airway Management Planned: Oral ETT  Additional Equipment:   Intra-op Plan:   Post-operative Plan: Extubation in OR  Informed Consent: I have reviewed the patients History and Physical, chart, labs and discussed the procedure including the risks, benefits and alternatives for the proposed anesthesia with the patient or authorized representative who has indicated his/her understanding and acceptance.   Dental advisory given  Plan Discussed with: CRNA and Anesthesiologist  Anesthesia Plan Comments:        Anesthesia Quick Evaluation

## 2013-06-10 NOTE — Interval H&P Note (Signed)
History and Physical Interval Note:  06/10/2013 7:14 AM  Erica Hughes  has presented today for surgery, with the diagnosis of Left Thigh Heterotopic Ossification  The various methods of treatment have been discussed with the patient and family. After consideration of risks, benefits and other options for treatment, the patient has consented to  Procedure(s) with comments: CLOSED MANIPULATION KNEE (Left) - Left Thigh/Knee Excision Heterotopic Bone, Knee Manipulation OSTEOCHONDROMA EXCISION (Left) as a surgical intervention .  The patient's history has been reviewed, patient examined, no change in status, stable for surgery.  I have reviewed the patient's chart and labs.  Questions were answered to the patient's satisfaction.     [name]

## 2013-06-10 NOTE — Progress Notes (Signed)
Orthopedic Tech Progress Note Patient Details:  Erica Hughes 03/25/1966 [id number]  CPM Left Knee CPM Left Knee: On Left Knee Flexion (Degrees): 60 Left Knee Extension (Degrees): 0 Additional Comments: Trapeze bar and foot roll   Shawnie Pons 06/10/2013, 10:57 AM

## 2013-06-11 LAB — URINE CULTURE: Colony Count: NO GROWTH

## 2013-06-11 LAB — GLUCOSE, CAPILLARY
Glucose-Capillary: 202 mg/dL — ABNORMAL HIGH (ref 70–99)
Glucose-Capillary: 174 mg/dL — ABNORMAL HIGH (ref 70–99)

## 2013-06-11 MED ORDER — CALCIUM CARBONATE 1250 (500 CA) MG PO TABS
1000.0000 mg | ORAL_TABLET | Freq: Two times a day (BID) | ORAL | Status: DC
  Administered 2013-06-11 – 2013-06-12 (×3): 1000 mg via ORAL
  Filled 2013-06-11 (×5): qty 2

## 2013-06-11 MED ORDER — INDOMETHACIN ER 75 MG PO CPCR
75.0000 mg | ORAL_CAPSULE | Freq: Every day | ORAL | Status: DC
  Administered 2013-06-12: 75 mg via ORAL
  Filled 2013-06-11 (×2): qty 1

## 2013-06-11 NOTE — Evaluation (Signed)
Physical Therapy Evaluation Patient Details Name: Erica Hughes MRN: [id number] DOB: Dec 19, 1965 Today's Date: 06/11/2013 Time: 8657-8469 PT Time Calculation (min): 17 min  PT Assessment / Plan / Recommendation History of Present Illness  Closed Manipulation Knee- Left Thigh/Knee Excision Heterotopic Bone, Knee Manipulation.  Clinical Impression  Patient presents with problems listed below.  Will benefit from acute PT to maximize independence prior to discharge home with husband.  Should progress well with PT.      PT Assessment  Patient needs continued PT services    Follow Up Recommendations  Outpatient PT;Supervision/Assistance - 24 hour    Does the patient have the potential to tolerate intense rehabilitation      Barriers to Discharge        Equipment Recommendations  None recommended by PT    Recommendations for Other Services     Frequency 7X/week    Precautions / Restrictions Precautions Precautions: Knee Restrictions Weight Bearing Restrictions: Yes LLE Weight Bearing: Weight bearing as tolerated   Pertinent Vitals/Pain       Mobility  Bed Mobility Bed Mobility: Supine to Sit;Sitting - Scoot to Edge of Bed Supine to Sit: 5: Supervision Sitting - Scoot to Edge of Bed: 5: Supervision Transfers Transfers: Stand to Sit;Sit to Stand Sit to Stand: 5: Supervision;From bed Stand to Sit: 5: Supervision;With armrests;With upper extremity assist;To chair/3-in-1 Details for Transfer Assistance: Pt demonstrates good technique and hand placement during transfers using RW. Ambulation/Gait Ambulation/Gait Assistance: 5: Supervision Ambulation Distance (Feet): 160 Feet Assistive device: Rolling walker Ambulation/Gait Assistance Details: Patient demonstrates safe use of RW.  Cues to stand upright during gait. Gait Pattern: Step-through pattern;Decreased stance time - left;Decreased step length - right;Antalgic;Trunk flexed Gait velocity: Slow gait speed    Exercises  Total Joint Exercises Ankle Circles/Pumps: AROM;Both;10 reps;Seated   PT Diagnosis: Abnormality of gait;Difficulty walking;Acute pain  PT Problem List: Decreased strength;Decreased range of motion;Decreased activity tolerance;Decreased balance;Decreased mobility;Decreased knowledge of use of DME;Decreased knowledge of precautions;Obesity;Pain PT Treatment Interventions: DME instruction;Gait training;Stair training;Functional mobility training;Therapeutic exercise;Patient/family education     PT Goals(Current goals can be found in the care plan section) Acute Rehab PT Goals Patient Stated Goal: To return home PT Goal Formulation: With patient/family Time For Goal Achievement: 06/18/13 Potential to Achieve Goals: Good  Visit Information  Last PT Received On: 06/11/13 Assistance Needed: +1 History of Present Illness: Closed Manipulation Knee- Left Thigh/Knee Excision Heterotopic Bone, Knee Manipulation.       Prior Functioning  Home Living Family/patient expects to be discharged to:: Private residence Living Arrangements: Spouse/significant other Available Help at Discharge: Family;Available 24 hours/day Type of Home: House Home Access: Stairs to enter [employer] of Steps: 1 (Tall step) Entrance Stairs-Rails: None Home Layout: One level Home Equipment: Cane - single point;Walker - 2 wheels;Bedside commode Prior Function Level of Independence: Independent with assistive device(s) (uses cane occasionally) Communication Communication: No difficulties Dominant Hand: Right    Cognition  Cognition Arousal/Alertness: Awake/alert Behavior During Therapy: WFL for tasks assessed/performed Overall Cognitive Status: Within Functional Limits for tasks assessed    Extremity/Trunk Assessment Upper Extremity Assessment Upper Extremity Assessment: Overall WFL for tasks assessed Lower Extremity Assessment Lower Extremity Assessment: LLE deficits/detail LLE Deficits / Details:  Decreased strength and ROM due to surgery/pain.  Able to lift LE off of bed - at least 3/5 LLE: Unable to fully assess due to pain Cervical / Trunk Assessment Cervical / Trunk Assessment: Normal   Balance    End of Session PT - End of Session Equipment  Utilized During Treatment: Gait belt Activity Tolerance: Patient tolerated treatment well Patient left: in chair;with call bell/phone within reach;with family/visitor present Nurse Communication: Mobility status CPM Left Knee CPM Left Knee: Off  GP     Vena Austria 06/11/2013, 6:16 PM Durenda Hurt. Renaldo Fiddler, [facility] Acute Rehab Services Pager [phone]

## 2013-06-11 NOTE — Progress Notes (Addendum)
Subjective: 1 Day Post-Op Procedure(s) (LRB): CLOSED MANIPULATION KNEE (Left) OSTEOCHONDROMA EXCISION (Left) Patient reports pain as moderate.    Objective: Vital signs in last 24 hours: Temp:  [98.6 F (37 Hughes)-99.3 F (37.4 Hughes)] 99 F (37.2 Hughes) (12/20 0600) Pulse Rate:  [78-98] 86 (12/20 1017) Resp:  [14-18] 16 (12/20 0858) BP: (118-163)/(54-89) 120/60 mmHg (12/20 1017) SpO2:  [95 %-100 %] 97 % (12/20 0858)  Intake/Output from previous day: 12/19 0701 - 12/20 0700 In: 3080 [P.O.:480; I.V.:2600] Out: 175 [Drains:75; Blood:100] Intake/Output this shift: Total I/O In: 480 [P.O.:480] Out: -   No results found for this basename: HGB,  in the last 72 hours No results found for this basename: WBC, RBC, HCT, PLT,  in the last 72 hours No results found for this basename: NA, K, CL, CO2, BUN, CREATININE, GLUCOSE, CALCIUM,  in the last 72 hours  Recent Labs  06/10/13 0634  INR 0.94    Neurologically intact  Assessment/Plan: 1 Day Post-Op Procedure(s) (LRB): CLOSED MANIPULATION KNEE (Left) OSTEOCHONDROMA EXCISION (Left) Up with therapy   SHE WILL GO HOME ON INDOCIN TO PREVENT HETEROTOPIC BONE FORMATION. Home Sunday or Monday.   Erica Hughes 06/11/2013, 11:19 AM

## 2013-06-11 NOTE — Progress Notes (Signed)
Utilization Review completed.  

## 2013-06-11 NOTE — Evaluation (Addendum)
Occupational Therapy Evaluation Patient Details Name: Erica Hughes MRN: [id number] DOB: 07-Nov-1965 Today's Date: 06/11/2013 Time: 4098-1191 OT Time Calculation (min): 24 min  OT Assessment / Plan / Recommendation History of present illness Left Closed Manipulation Knee- Left Thigh/Knee Excision Heterotopic Bone, Knee Manipulation.   Clinical Impression   Pt is 47 y/o female s/p L knee manipulation. She demonstrates good technique during transfers and will have family/spouse PRN assist @ d/c. Pt able to perform LB dressing w/o a/e seated today & demonstrated simulated shower transfer w/ supervision. Pt is going to borrow 3:1 & walker from family/friends & reports no further acute OT needs at this time.    OT Assessment  Patient does not need any further OT services    Follow Up Recommendations  No OT follow up;Supervision - Intermittent    Barriers to Discharge      Equipment Recommendations  None recommended by OT (Pt plans to borrow 3:1 & SW from family )    Recommendations for Other Services    Frequency       Precautions / Restrictions Precautions Precautions: Knee Restrictions Weight Bearing Restrictions: Yes LLE Weight Bearing: Weight bearing as tolerated   Pertinent Vitals/Pain 2/10 L knee. Repositioned, rest after increased activity.    ADL  Eating/Feeding: Simulated;Independent Where Assessed - Eating/Feeding: Chair Grooming: Performed;Wash/dry hands;Wash/dry face;Modified independent Where Assessed - Grooming: Unsupported standing;Supported standing Upper Body Bathing: Simulated;Modified independent Where Assessed - Upper Body Bathing: Unsupported sitting Lower Body Bathing: Simulated;Set up;Supervision/safety Where Assessed - Lower Body Bathing: Supported sit to stand Upper Body Dressing: Simulated;Modified independent;Set up Where Assessed - Upper Body Dressing: Unsupported sitting Lower Body Dressing: Performed;Modified independent;Set up Where Assessed -  Lower Body Dressing: Supported sitting Toilet Transfer: Performed;Supervision/safety Statistician Method: Sit to Barista: Raised toilet seat with arms (or 3-in-1 over toilet) Toileting - Clothing Manipulation and Hygiene: Performed;Supervision/safety Where Assessed - Toileting Clothing Manipulation and Hygiene: Sit to stand from 3-in-1 or toilet Tub/Shower Transfer: Simulated;Supervision/safety (Simulated shower transfer stepping over foam roll in room forward step into/out and posterior step into/out transfer x4) Tub/Shower Transfer Method: Ambulating;Other (comment) (w/ SW) Tub/Shower Transfer Equipment: Walk in shower Equipment Used: Standard walker;Other (comment) (3:1 over toilet) Transfers/Ambulation Related to ADLs: Pt overall supervision level transfers using SW. Pt demonstrates good safety and hand placement during transfers. ADL Comments: Pt educated in role of OT, pt participated in ADL retraining session w/ focus on transfers (toilet and simulated shower transfers) w/ supervision, grooming standing at sink, functional mobility in room (bathroom, chair, door and back). Discussed supervision when at home for shower transfers, pt states husband will assist PRN. Pt also able to perform LB dressing seated w/o a/e noted LLE. Discussed safety at home, removing throw rugs and set up for ADL's/DME. Pt verbalized understanding and reports no further acute OT needs.    OT Diagnosis:    OT Problem List:   OT Treatment Interventions:     OT Goals(Current goals can be found in the care plan section) Acute Rehab OT Goals Patient Stated Goal: Home   Visit Information  Last OT Received On: 06/11/13 History of Present Illness: Closed Manipulation Knee- Left Thigh/Knee Excision Heterotopic Bone, Knee Manipulation.       Prior Functioning     Home Living Family/patient expects to be discharged to:: Private residence Living Arrangements: Spouse/significant  other Available Help at Discharge: Family;Available 24 hours/day Type of Home: House Home Access: Stairs to enter [employer] of Steps: 1 STE Home Layout: One  level Home Equipment: Other (comment);Walker - 2 wheels;Bedside commode (Going to borrow DME from friends) Prior Function Level of Independence: Independent with assistive device(s);Independent Comments: Amb w/ SPC prior to this surgery Communication Communication: No difficulties Dominant Hand: Right    Vision/Perception Vision - History Baseline Vision: Wears glasses only for reading Patient Visual Report: No change from baseline   Cognition  Cognition Arousal/Alertness: Awake/alert Behavior During Therapy: WFL for tasks assessed/performed Overall Cognitive Status: Within Functional Limits for tasks assessed    Extremity/Trunk Assessment Upper Extremity Assessment Upper Extremity Assessment: Overall WFL for tasks assessed Lower Extremity Assessment Lower Extremity Assessment: Defer to PT evaluation    Mobility Bed Mobility Bed Mobility: Not assessed (Up in chair) Transfers Transfers: Sit to Stand;Stand to Sit Sit to Stand: 6: Modified independent (Device/Increase time);5: Supervision;From chair/3-in-1;With armrests Stand to Sit: 6: Modified independent (Device/Increase time);5: Supervision;With armrests;To chair/3-in-1 Details for Transfer Assistance: Pt demonstrates good technique and hand placement during transfers using SW.        Balance Balance Balance Assessed: Yes Static Sitting Balance Static Sitting - Balance Support: No upper extremity supported;Feet supported Dynamic Sitting Balance Dynamic Sitting - Balance Support: No upper extremity supported;Feet supported Static Standing Balance Static Standing - Balance Support: No upper extremity supported;During functional activity   End of Session OT - End of Session Equipment Utilized During Treatment: Other (comment) (SW & 3:1 over  toilet) Activity Tolerance: Patient tolerated treatment well Patient left: in chair;with call bell/phone within reach;with family/visitor present Nurse Communication: Mobility status CPM Left Knee CPM Left Knee: Off  GO Functional Assessment Tool Used: Clinical judgement Functional Limitation: Self care Self Care Current Status (A5409): At least 1 percent but less than 20 percent impaired, limited or restricted Self Care Goal Status (W1191): At least 1 percent but less than 20 percent impaired, limited or restricted Self Care Discharge Status [phone]): At least 1 percent but less than 20 percent impaired, limited or restricted   Alm Bustard 06/11/2013, 10:22 AM

## 2013-06-11 NOTE — Progress Notes (Signed)
Patient ID: Erica Hughes, female   DOB: May 02, 1966, 47 y.o.   MRN: [id number] HV removed.

## 2013-06-12 LAB — GLUCOSE, CAPILLARY: Glucose-Capillary: 154 mg/dL — ABNORMAL HIGH (ref 70–99)

## 2013-06-12 MED ORDER — ASPIRIN EC 325 MG PO TBEC: 325.0000 mg | DELAYED_RELEASE_TABLET | Freq: Every day | ORAL | Status: AC

## 2013-06-12 MED ORDER — OXYCODONE-ACETAMINOPHEN 5-325 MG PO TABS: 2.0000 | ORAL_TABLET | ORAL | Status: AC | PRN

## 2013-06-12 NOTE — Progress Notes (Signed)
Subjective: 2 Days Post-Op Procedure(s) (LRB): CLOSED MANIPULATION KNEE (Left) OSTEOCHONDROMA EXCISION (Left) Patient reports pain as mild.    Objective: Vital signs in last 24 hours: Temp:  [97.8 F (36.6 C)-98.2 F (36.8 C)] 98 F (36.7 C) (12/21 0600) Pulse Rate:  [82-93] 84 (12/21 1010) Resp:  [16-20] 16 (12/21 1148) BP: (111-130)/(45-70) 115/54 mmHg (12/21 1010) SpO2:  [94 %-97 %] 96 % (12/21 1148)  Intake/Output from previous day: 12/20 0701 - 12/21 0700 In: 1538.8 [P.O.:1200; I.V.:338.8] Out: -  Intake/Output this shift: Total I/O In: 360 [P.O.:360] Out: -   No results found for this basename: HGB,  in the last 72 hours No results found for this basename: WBC, RBC, HCT, PLT,  in the last 72 hours No results found for this basename: NA, K, CL, CO2, BUN, CREATININE, GLUCOSE, CALCIUM,  in the last 72 hours  Recent Labs  06/10/13 0634  INR 0.94    Neurologically intact  Assessment/Plan: 2 Days Post-Op Procedure(s) (LRB): CLOSED MANIPULATION KNEE (Left) OSTEOCHONDROMA EXCISION (Left) Up with therapy Discharge home  Outpatient PT Eshawn Coor C 06/12/2013, 12:22 PM

## 2013-06-12 NOTE — Progress Notes (Signed)
Physical Therapy Treatment Patient Details Name: Erica Hughes MRN: [id number] DOB: 02-19-1966 Today's Date: 06/12/2013 Time: 4696-2952 PT Time Calculation (min): 12 min  PT Assessment / Plan / Recommendation  History of Present Illness Closed Manipulation Knee- Left Thigh/Knee Excision Heterotopic Bone, Knee Manipulation.   PT Comments   Pt moving well.  Safe to d/c home from mobility standpoint when MD feels medically ready.    Follow Up Recommendations  Outpatient PT;Supervision/Assistance - 24 hour     Does the patient have the potential to tolerate intense rehabilitation     Barriers to Discharge        Equipment Recommendations  None recommended by PT    Recommendations for Other Services    Frequency 7X/week   Progress towards PT Goals Progress towards PT goals: Progressing toward goals  Plan Current plan remains appropriate    Precautions / Restrictions Precautions Precautions: Knee Restrictions LLE Weight Bearing: Weight bearing as tolerated   Pertinent Vitals/Pain 2./10 Lt knee.      Mobility  Bed Mobility Bed Mobility: Not assessed Details for Bed Mobility Assistance: Pt sitting in recliner upon arrival Transfers Transfers: Sit to Stand;Stand to Sit Sit to Stand: 6: Modified independent (Device/Increase time);With upper extremity assist;With armrests;From chair/3-in-1 Stand to Sit: 6: Modified independent (Device/Increase time);With upper extremity assist;With armrests;To chair/3-in-1 Ambulation/Gait Ambulation/Gait Assistance: 5: Supervision Ambulation Distance (Feet): 180 Feet Assistive device: Rolling walker Ambulation/Gait Assistance Details: cues for more upright posture, & to decrease trunk flexion forwards with Rt swing phase.   Gait Pattern: Step-through pattern;Antalgic General Gait Details: steady Stairs: No (pt states she only has 1 step & feels comfortable with performing; pt verbalized proper technique) Wheelchair Mobility Wheelchair  Mobility: No    Exercises Total Joint Exercises Ankle Circles/Pumps: AROM;Both;10 reps Quad Sets: AROM;Strengthening;Both;10 reps Heel Slides: AAROM;Strengthening;Left;10 reps Hip ABduction/ADduction: AAROM;Strengthening;Left;10 reps Straight Leg Raises: AAROM;Strengthening;Left;10 reps Long Arc Quad: AROM;Strengthening;Left;10 reps Knee Flexion: AAROM;Strengthening;Left;5 reps;Seated     PT Goals (current goals can now be found in the care plan section) Acute Rehab PT Goals Patient Stated Goal: To return home PT Goal Formulation: With patient/family Time For Goal Achievement: 06/18/13 Potential to Achieve Goals: Good  Visit Information  Last PT Received On: 06/12/13 Assistance Needed: +1 History of Present Illness: Closed Manipulation Knee- Left Thigh/Knee Excision Heterotopic Bone, Knee Manipulation.    Subjective Data  Patient Stated Goal: To return home   Cognition  Cognition Arousal/Alertness: Awake/alert Behavior During Therapy: WFL for tasks assessed/performed Overall Cognitive Status: Within Functional Limits for tasks assessed    Balance     End of Session PT - End of Session Activity Tolerance: Patient tolerated treatment well Patient left: in chair;with call bell/phone within reach Nurse Communication: Mobility status   GP     Lara Mulch 06/12/2013, 8:47 AM  Verdell Face, PTA [phone] 06/12/2013

## 2013-06-12 NOTE — Progress Notes (Signed)
Subjective: 2 Days Post-Op Procedure(s) (LRB): CLOSED MANIPULATION KNEE (Left) OSTEOCHONDROMA EXCISION (Left) Patient reports pain as mild.    Objective: Vital signs in last 24 hours: Temp:  [97.8 F (36.6 C)-98.2 F (36.8 C)] 98 F (36.7 C) (12/21 0600) Pulse Rate:  [82-93] 84 (12/21 1010) Resp:  [16-20] 16 (12/21 1148) BP: (111-130)/(45-70) 115/54 mmHg (12/21 1010) SpO2:  [94 %-97 %] 96 % (12/21 1148)  Intake/Output from previous day: 12/20 0701 - 12/21 0700 In: 1538.8 [P.O.:1200; I.V.:338.8] Out: -  Intake/Output this shift: Total I/O In: 360 [P.O.:360] Out: -   No results found for this basename: HGB,  in the last 72 hours No results found for this basename: WBC, RBC, HCT, PLT,  in the last 72 hours No results found for this basename: NA, K, CL, CO2, BUN, CREATININE, GLUCOSE, CALCIUM,  in the last 72 hours  Recent Labs  06/10/13 0634  INR 0.94    Neurologically intact  Assessment/Plan: 2 Days Post-Op Procedure(s) (LRB): CLOSED MANIPULATION KNEE (Left) OSTEOCHONDROMA EXCISION (Left) Up with therapy  Jessia Kief C 06/12/2013, 12:03 PM

## 2013-06-14 ENCOUNTER — Encounter (HOSPITAL_COMMUNITY): Payer: Self-pay | Admitting: Orthopaedic Surgery

## 2013-06-21 NOTE — Discharge Summary (Signed)
Physician Discharge Summary  Patient ID: Erica Hughes MRN: [id number] DOB/AGE: 1966/03/27 47 y.o.  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 Hypertension   . Diabetes mellitus without complication     7-8 YRS PLUS  . GERD (gastroesophageal reflux disease)   . Headache(784.0)   . Arthritis   . Anxiety   . Depression     BIPOLAR    Surgeries: Procedure(s): CLOSED MANIPULATION LEFT KNEE  EXCISION OF HETEROTOPIC BONE LEFT FEMUR on 06/10/2013   Consultants (if any):  NONE  Discharged Condition: Improved  Hospital Course: Erica Hughes is an 47 y.o. female who was admitted 06/10/2013 with a diagnosis of Heterotopic ossification of bone and went to the operating room on 06/10/2013 and underwent the above named procedures.    She was given perioperative antibiotics:      Anti-infectives   Start     Dose/Rate Route Frequency Ordered Stop   06/10/13 0815  clindamycin (CLEOCIN) IVPB 900 mg     900 mg 100 mL/hr over 30 Minutes Intravenous To Surgery 06/10/13 0805 06/10/13 0805   06/10/13 0600  vancomycin (VANCOCIN) 1,500 mg in sodium chloride 0.9 % 500 mL IVPB  Status:  Discontinued     1,500 mg 250 mL/hr over 120 Minutes Intravenous On call 06/09/13 1435 06/10/13 1238    .  She was given sequential compression devices, early ambulation, and aspirin for DVT prophylaxis.  She benefited maximally from the hospital stay and there were no complications.    Recent vital signs:  Filed Vitals:   06/12/13 1148  BP:   Pulse:   Temp:   Resp: 16    Recent laboratory studies:  Lab Results  Component Value Date   HGB 12.4 06/07/2013   Lab Results  Component Value Date   WBC  14.1* 06/07/2013   PLT 389 06/07/2013   Lab Results  Component Value Date   INR 0.94 06/10/2013   Lab Results  Component Value Date   NA 138 06/07/2013   K 4.7 06/07/2013   CL 102 06/07/2013   CO2 25 06/07/2013   BUN 13 06/07/2013   CREATININE 0.88 06/07/2013   GLUCOSE 120* 06/07/2013    Discharge Medications:     Medication List         acetaminophen 500 MG tablet  Commonly known as:  TYLENOL  Take 1,000 mg by mouth every 6 (six) hours as needed for moderate pain or headache.     ARIPiprazole 5 MG tablet  Commonly known as:  ABILIFY  Take 5 mg by mouth daily.     aspirin EC 325 MG tablet  Take 1 tablet (325 mg total) by mouth daily.     calcium carbonate 600 MG Tabs tablet  Commonly known as:  OS-CAL  Take 600 mg by mouth 2 (two) times daily with a meal.     cholecalciferol 400 UNITS Tabs tablet  Commonly known as:  VITAMIN D  Take 400 Units by mouth 2 (two) times daily.     desvenlafaxine 50 MG 24 hr tablet  Commonly known as:  PRISTIQ  Take 50 mg by mouth daily.     enalapril 20 MG tablet  Commonly known as:  VASOTEC  Take 20 mg by mouth daily.  gabapentin 300 MG capsule  Commonly known as:  NEURONTIN  Take 300 mg by mouth 2 (two) times daily.     insulin glargine 100 UNIT/ML injection  Commonly known as:  LANTUS  Inject 20-60 Units into the skin 2 (two) times daily. 60 units in the morning and 20 units at bedtime.     medroxyPROGESTERone 150 MG/ML injection  Commonly known as:  DEPO-PROVERA  Inject 150 mg into the muscle every 3 (three) months.     metFORMIN 1000 MG tablet  Commonly known as:  GLUCOPHAGE  Take 1,000 mg by mouth 2 (two) times daily with a meal.     metoprolol 100 MG tablet  Commonly known as:  LOPRESSOR  Take 100 mg by mouth 2 (two) times daily.     omeprazole 20 MG capsule  Commonly known as:  PRILOSEC  Take 20 mg by mouth daily.     oxyCODONE-acetaminophen 5-325 MG per tablet  Commonly known as:  ROXICET  Take 2  tablets by mouth every 4 (four) hours as needed for severe pain.     QUEtiapine 200 MG 24 hr tablet  Commonly known as:  SEROQUEL XR  Take 200 mg by mouth at bedtime.     zolpidem 10 MG tablet  Commonly known as:  AMBIEN  Take 10 mg by mouth at bedtime.        Diagnostic Studies: Dg Chest 2 View  06/07/2013   CLINICAL DATA:  Hypertension.  Diabetes.  EXAM: CHEST  2 VIEW  COMPARISON:  03/31/2012.  FINDINGS: Mediastinum and hilar structures are normal. Mild subsegmental atelectasis versus scarring right lung base. No pleural effusion or pneumothorax. Heart size normal. Pulmonary vascularity normal. Degenerative changes thoracic spine and both shoulders.  IMPRESSION: Mild right lung base subsegmental atelectasis and/or scarring. Chest is stable from 03/31/2012.   Electronically Signed   By: Maisie Fus  Register   On: 06/07/2013 14:18   Dg Knee 1-2 Views Left  06/10/2013   CLINICAL DATA:  Left knee surgery.  EXAM: LEFT KNEE - 1-2 VIEW  COMPARISON:  05/18/2013.  FINDINGS: Cross-table portable lateral view available for review. Patient has had prior left knee replacement. Postsurgical changes noted about the soft tissues.  IMPRESSION: Postsurgical changes. Patient has had a total left knee replacement. To evaluate the prosthesis standard knee series will be needed.   Electronically Signed   By: Maisie Fus  Register   On: 06/10/2013 09:30    Disposition: 01-Home or Self Care DISCHARGE INSTRUCTIONS TO PATIENT: Keep knee incision dry for 5 days post op then may wet while bathing. Therapy daily and goal full extension and greater than 90 degrees flexion. Call if fever or chills or increased drainage. Go to ER if acutely short of breath or call for ambulance. Return for follow up in 2 weeks. May full weight bear on the surgical leg unless told otherwise. Use knee immobilizer until able to straight leg raise off bed with knee stable. In house walking for first 2 weeks.   Follow-up Information   Follow  up with Eldred Manges, MD. Schedule an appointment as soon as possible for a visit in 2 weeks.   Specialty:  Orthopedic Surgery   Contact information:   [address] [place] [address] [phone]        Signed: STEVEN, VEAZIE 06/21/2013, 11:01 AM

## 2014-09-09 IMAGING — CT CT FEMUR *L* W/O CM
3 of 5 series · 16 of 33 positions shown, 19 images · non-contrast
Comparison: Intraoperative radiographs 06/08/2012, [DATE] phase bone
scan 05/12/2013 and radiographs 05/18/2013.

CLINICAL DATA: Painful palpable knot involving the distal anterior
left thigh following left knee arthroplasty 9 months ago.

EXAM:
CT OF THE LEFT FEMUR WITHOUT CONTRAST
TECHNIQUE: Multi detector CT imaging of the left thigh was performed without
intravenous contrast. Multiplanar reformatted images were generated.

[Series 4: lower ext soft · axial · 0.55mm/px · z∈[-480,-35]mm · 11 of 211 slices shown, 14 images]
[im 17/211  soft-tissue]
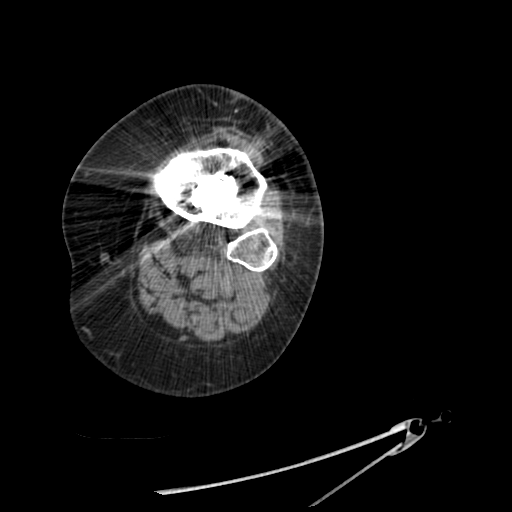
[im 17/211  bone]
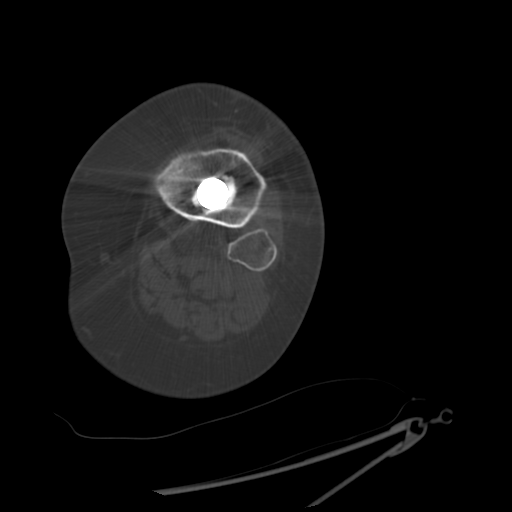
[im 33/211  bone]
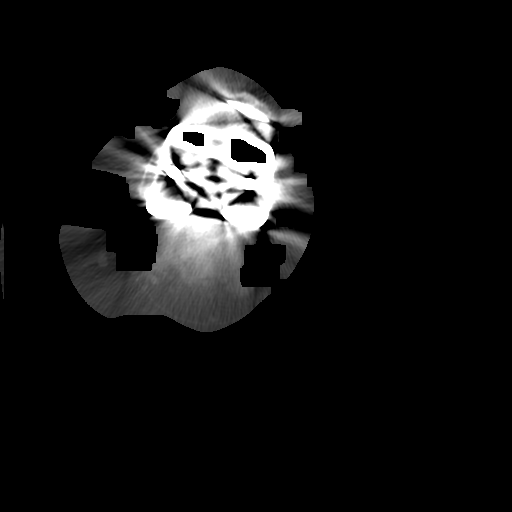
[im 49/211  bone]
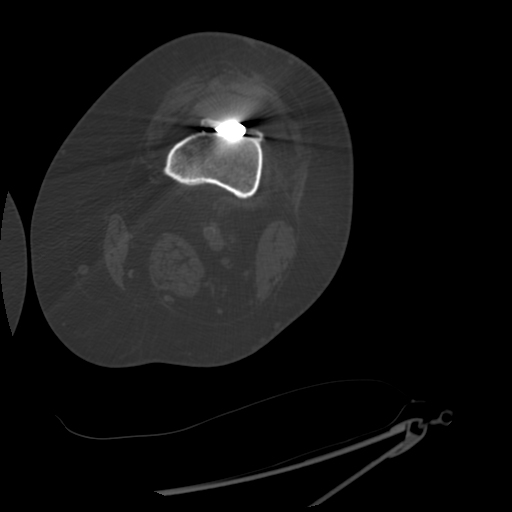
[im 65/211  bone]
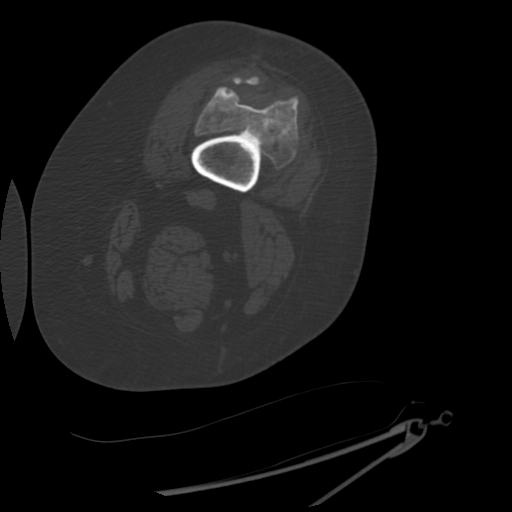
[im 81/211  soft-tissue]
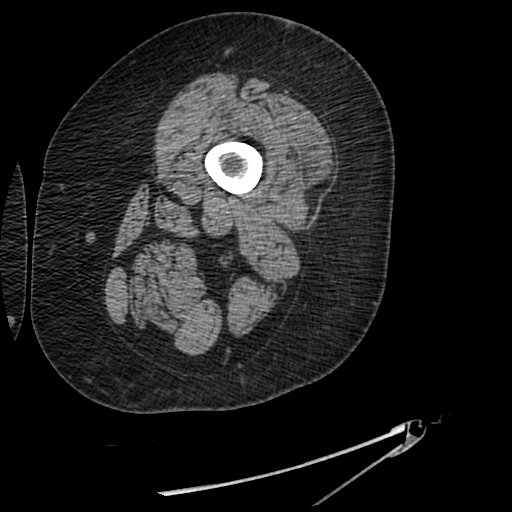
[im 81/211  bone]
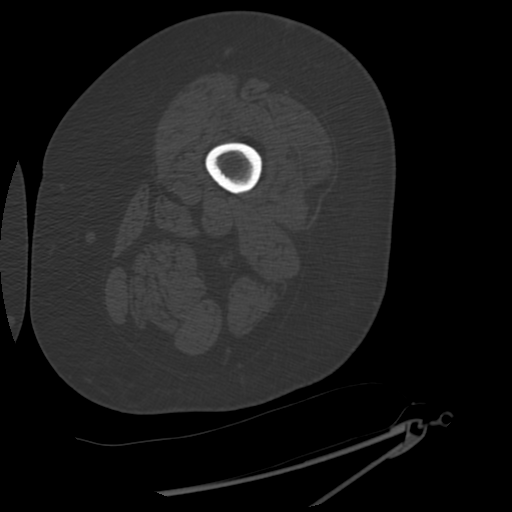
[im 114/211  bone]
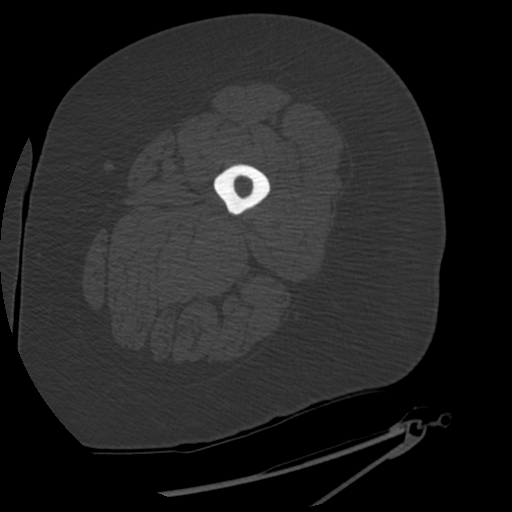
[im 130/211  bone]
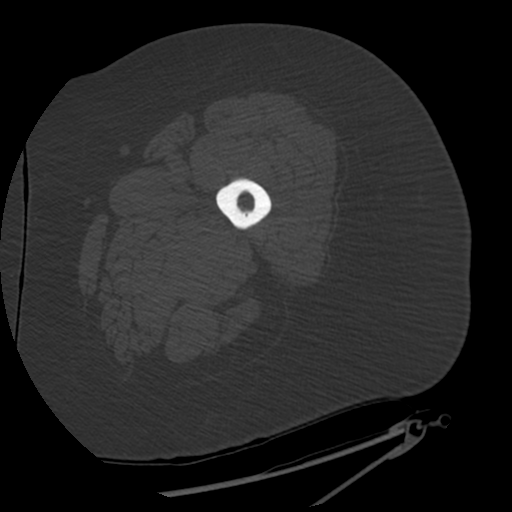
[im 146/211  bone]
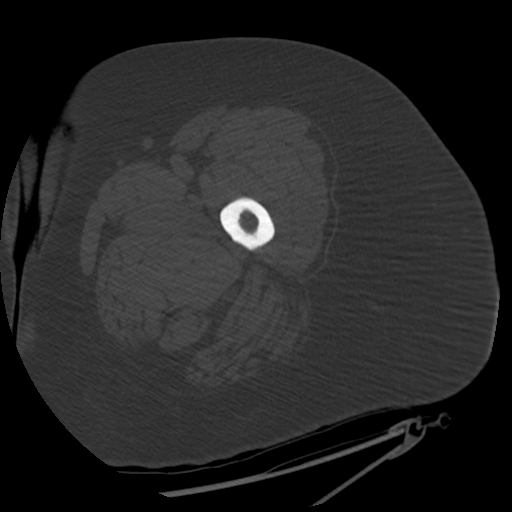
[im 162/211  soft-tissue]
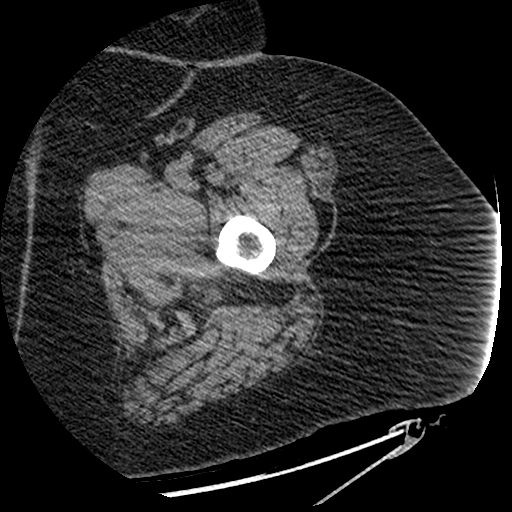
[im 162/211  bone]
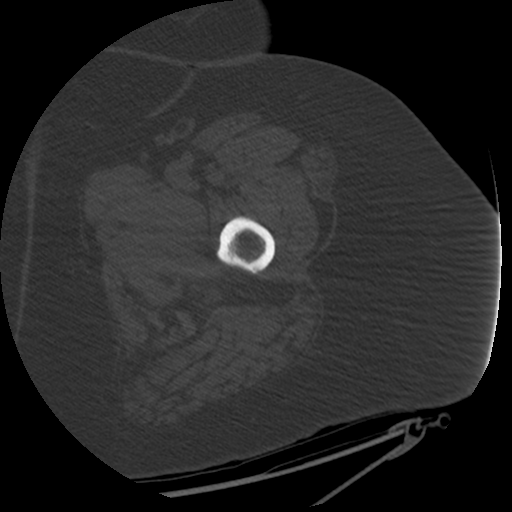
[im 178/211  bone]
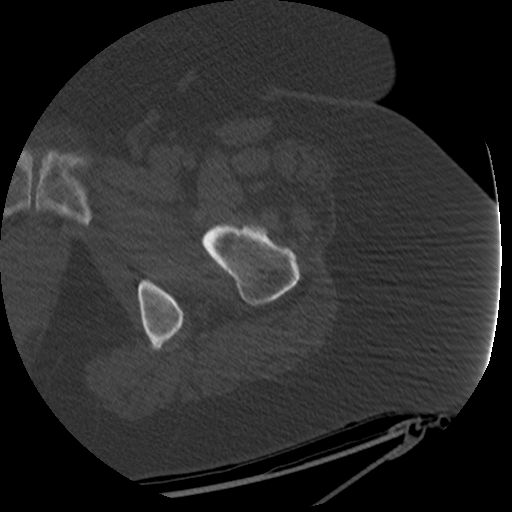
[im 194/211  bone]
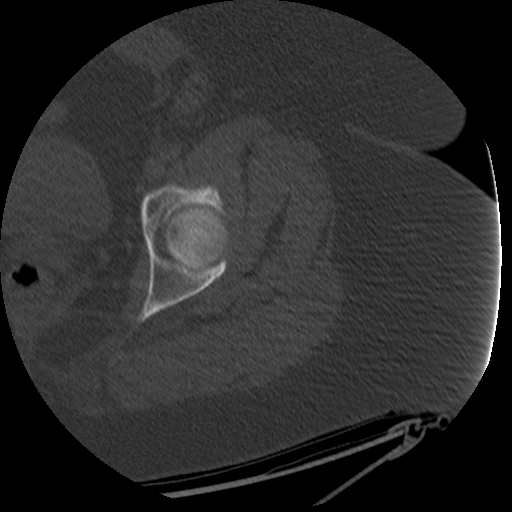

[Series 300: cor bone · coronal · 1.06mm/px · 1 of 131 slices shown]
[im 66/131  bone]
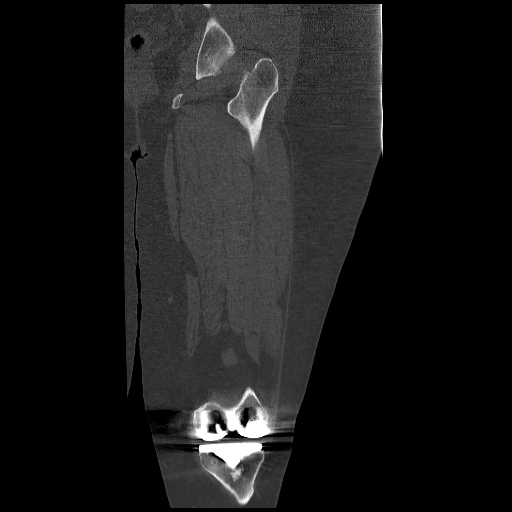

[Series 301: sag bone · sagittal · 1.06mm/px · 4 of 130 slices shown]
[im 26/130  bone]
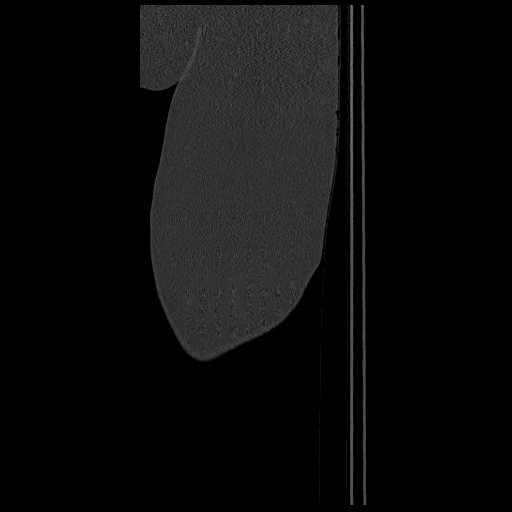
[im 52/130  bone]
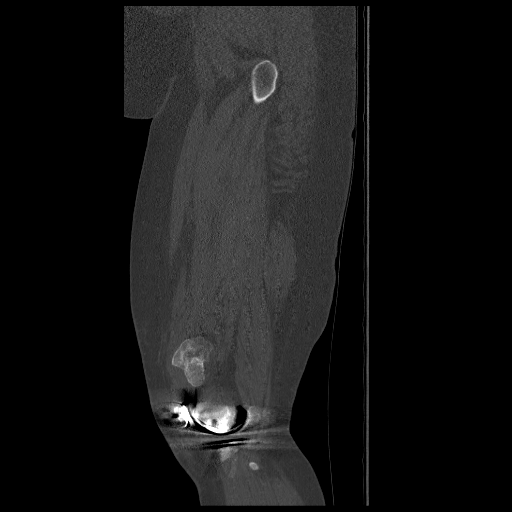
[im 78/130  bone]
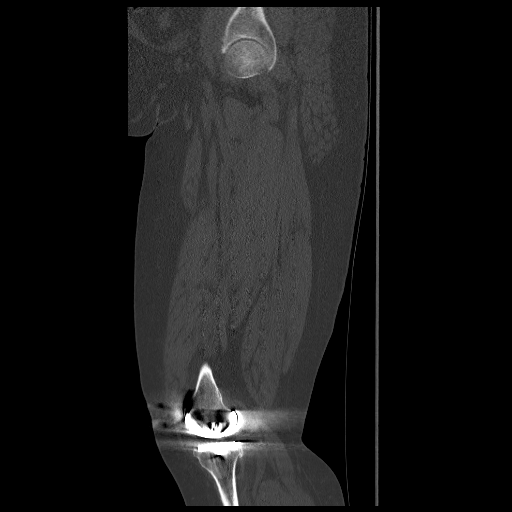
[im 104/130  bone]
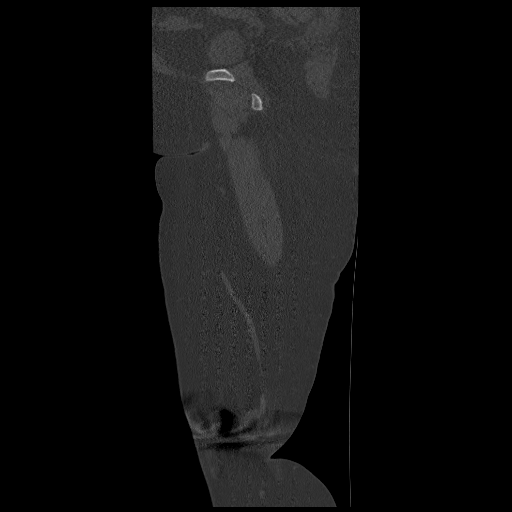

[16 of 33 positions shown; findings below may reference images not displayed]

FINDINGS: As demonstrated radiographically, there is a large area of
heterotopic ossification anterior to the distal femur. This is
contiguous with the anterior femoral cortex and measures up to
cm transverse and 5.5 cm cephalocaudad. The margins are sclerotic.
Anterior to this dominant heterotopic ossification are several
ill-defined calcific densities associated with the rectus femoris
and quadriceps tendons. These extend 5.9 cm superior to the superior
pole of the patella and were not present on the intraoperative
radiographs. The patella is normally position without inferior
subluxation or laxity of the patellar tendon.

There is no loosening of the femoral or tibial components of the
total knee arthroplasty. There is no evidence of acute fracture,
dislocation or bone destruction.

The proximal left femur and hip joint appear unremarkable. No focal
muscular atrophy is identified.
IMPRESSION: 1. There is a large area of heterotopic ossification involving the
distal left thigh anteriorly, contiguous with the femoral cortex.
2. This ossification likely impinges on the quadriceps tendon which
appears attenuated with multiple ill-defined calcifications. The
tendon is likely partially torn, although no complete rupture or
distal retraction of the patella is demonstrated. Correlate
clinically.
3. No evidence of loosening of the left total knee arthroplasty.
4. No acute osseous findings

## 2017-08-21 DEATH — deceased
